# Patient Record
Sex: Female | Born: 1945 | Race: Black or African American | Hispanic: No | Marital: Married
Health system: Southern US, Community
[De-identification: ages and names within clinical notes are randomized; demographics above are authoritative.]

## PROBLEM LIST (undated history)

## (undated) DIAGNOSIS — M109 Gout, unspecified: Secondary | ICD-10-CM

## (undated) DIAGNOSIS — I10 Essential (primary) hypertension: Secondary | ICD-10-CM

## (undated) DIAGNOSIS — E119 Type 2 diabetes mellitus without complications: Secondary | ICD-10-CM

## (undated) HISTORY — DX: Type 2 diabetes mellitus without complications: E11.9

## (undated) HISTORY — DX: Essential (primary) hypertension: I10

## (undated) HISTORY — DX: Gout, unspecified: M10.9

---

## 2020-07-06 ENCOUNTER — Emergency Department (HOSPITAL_COMMUNITY): Payer: Medicare (Managed Care)

## 2020-07-06 ENCOUNTER — Encounter (HOSPITAL_COMMUNITY): Payer: Self-pay | Admitting: Emergency Medicine

## 2020-07-06 ENCOUNTER — Emergency Department (HOSPITAL_COMMUNITY)
Admission: EM | Admit: 2020-07-06 | Discharge: 2020-07-06 | Disposition: A | Discharge status: Home / Self Care | Payer: Medicare (Managed Care) | Attending: Emergency Medicine | Admitting: Emergency Medicine

## 2020-07-06 ENCOUNTER — Encounter: Payer: Self-pay | Admitting: Emergency Medicine

## 2020-07-06 ENCOUNTER — Other Ambulatory Visit: Payer: Self-pay

## 2020-07-06 DIAGNOSIS — I2699 Other pulmonary embolism without acute cor pulmonale: Secondary | ICD-10-CM | POA: Insufficient documentation

## 2020-07-06 DIAGNOSIS — J189 Pneumonia, unspecified organism: Secondary | ICD-10-CM | POA: Insufficient documentation

## 2020-07-06 DIAGNOSIS — Z20822 Contact with and (suspected) exposure to covid-19: Secondary | ICD-10-CM | POA: Insufficient documentation

## 2020-07-06 DIAGNOSIS — Z79899 Other long term (current) drug therapy: Secondary | ICD-10-CM | POA: Insufficient documentation

## 2020-07-06 DIAGNOSIS — E119 Type 2 diabetes mellitus without complications: Secondary | ICD-10-CM | POA: Diagnosis not present

## 2020-07-06 DIAGNOSIS — E860 Dehydration: Secondary | ICD-10-CM | POA: Insufficient documentation

## 2020-07-06 DIAGNOSIS — R531 Weakness: Secondary | ICD-10-CM

## 2020-07-06 DIAGNOSIS — Z7984 Long term (current) use of oral hypoglycemic drugs: Secondary | ICD-10-CM | POA: Diagnosis not present

## 2020-07-06 DIAGNOSIS — I10 Essential (primary) hypertension: Secondary | ICD-10-CM | POA: Insufficient documentation

## 2020-07-06 DIAGNOSIS — M109 Gout, unspecified: Secondary | ICD-10-CM | POA: Insufficient documentation

## 2020-07-06 DIAGNOSIS — Z7982 Long term (current) use of aspirin: Secondary | ICD-10-CM | POA: Diagnosis not present

## 2020-07-06 LAB — CBC WITH DIFFERENTIAL/PLATELET
Abs Immature Granulocytes: 0.05 10*3/uL (ref 0.00–0.07)
Basophils Absolute: 0.1 10*3/uL (ref 0.0–0.1)
Basophils Relative: 1 %
Eosinophils Absolute: 0.2 10*3/uL (ref 0.0–0.5)
Eosinophils Relative: 2 %
HCT: 30.4 % — ABNORMAL LOW (ref 36.0–46.0)
Hemoglobin: 9.7 g/dL — ABNORMAL LOW (ref 12.0–15.0)
Immature Granulocytes: 0 %
Lymphocytes Relative: 17 %
Lymphs Abs: 1.9 10*3/uL (ref 0.7–4.0)
MCH: 30.6 pg (ref 26.0–34.0)
MCHC: 31.9 g/dL (ref 30.0–36.0)
MCV: 95.9 fL (ref 80.0–100.0)
Monocytes Absolute: 0.8 10*3/uL (ref 0.1–1.0)
Monocytes Relative: 7 %
Neutro Abs: 8.4 10*3/uL — ABNORMAL HIGH (ref 1.7–7.7)
Neutrophils Relative %: 73 %
Platelets: 155 10*3/uL (ref 150–400)
RBC: 3.17 MIL/uL — ABNORMAL LOW (ref 3.87–5.11)
RDW: 13.5 % (ref 11.5–15.5)
WBC: 11.4 10*3/uL — ABNORMAL HIGH (ref 4.0–10.5)
nRBC: 0 % (ref 0.0–0.2)

## 2020-07-06 LAB — COMPREHENSIVE METABOLIC PANEL
ALT: 16 U/L (ref 0–44)
AST: 27 U/L (ref 15–41)
Albumin: 3.4 g/dL — ABNORMAL LOW (ref 3.5–5.0)
Alkaline Phosphatase: 84 U/L (ref 38–126)
Anion gap: 7 (ref 5–15)
BUN: 24 mg/dL — ABNORMAL HIGH (ref 8–23)
CO2: 22 mmol/L (ref 22–32)
Calcium: 8.5 mg/dL — ABNORMAL LOW (ref 8.9–10.3)
Chloride: 107 mmol/L (ref 98–111)
Creatinine, Ser: 1.21 mg/dL — ABNORMAL HIGH (ref 0.44–1.00)
GFR, Estimated: 46 mL/min — ABNORMAL LOW (ref >60)
Glucose, Bld: 236 mg/dL — ABNORMAL HIGH (ref 70–99)
Potassium: 4.5 mmol/L (ref 3.5–5.1)
Sodium: 136 mmol/L (ref 135–145)
Total Bilirubin: 0.8 mg/dL (ref 0.3–1.2)
Total Protein: 6.2 g/dL — ABNORMAL LOW (ref 6.5–8.1)

## 2020-07-06 LAB — URINALYSIS, ROUTINE W REFLEX MICROSCOPIC
Bilirubin Urine: NEGATIVE
Glucose, UA: 150 mg/dL — AB
Hgb urine dipstick: NEGATIVE
Ketones, ur: NEGATIVE mg/dL
Leukocytes,Ua: NEGATIVE
Nitrite: NEGATIVE
Protein, ur: NEGATIVE mg/dL
Specific Gravity, Urine: 1.012 (ref 1.005–1.030)
pH: 7 (ref 5.0–8.0)

## 2020-07-06 LAB — RESP PANEL BY RT-PCR (FLU A&B, COVID) ARPGX2
Influenza A by PCR: NEGATIVE
Influenza B by PCR: NEGATIVE
SARS Coronavirus 2 by RT PCR: NEGATIVE

## 2020-07-06 LAB — ETHANOL: Alcohol, Ethyl (B): <10 mg/dL (ref <10)

## 2020-07-06 LAB — CBG MONITORING, ED: Glucose-Capillary: 256 mg/dL — ABNORMAL HIGH (ref 70–99)

## 2020-07-06 MED ORDER — DOXYCYCLINE HYCLATE 100 MG PO CAPS: 100.0000 mg | ORAL_CAPSULE | Freq: Two times a day (BID) | ORAL | 0 refills | Status: AC

## 2020-07-06 MED ORDER — IOHEXOL 350 MG/ML SOLN
75.0000 mL | Freq: Once | INTRAVENOUS | Status: AC | PRN
  Administered 2020-07-06: 75 mL via INTRAVENOUS

## 2020-07-06 MED ORDER — SODIUM CHLORIDE 0.9 % IV BOLUS
1000.0000 mL | Freq: Once | INTRAVENOUS | Status: AC
Start: 2020-07-06 — End: 2020-07-06
  Administered 2020-07-06: 1000 mL via INTRAVENOUS

## 2020-07-06 MED ORDER — ONDANSETRON 4 MG PO TBDP: 4.0000 mg | ORAL_TABLET | Freq: Three times a day (TID) | ORAL | 0 refills | Status: AC | PRN

## 2020-07-06 MED ORDER — DOXYCYCLINE HYCLATE 100 MG PO TABS
100.0000 mg | ORAL_TABLET | Freq: Once | ORAL | Status: AC
  Administered 2020-07-06: 100 mg via ORAL
  Filled 2020-07-06: qty 1

## 2020-07-06 MED ORDER — SODIUM CHLORIDE 0.9 % IV SOLN
12.5000 mg | Freq: Four times a day (QID) | INTRAVENOUS | Status: DC | PRN
  Filled 2020-07-06: qty 0.5

## 2020-07-06 MED ORDER — MECLIZINE HCL 25 MG PO TABS
25.0000 mg | ORAL_TABLET | Freq: Once | ORAL | Status: AC
  Administered 2020-07-06: 25 mg via ORAL
  Filled 2020-07-06: qty 1

## 2020-07-06 NOTE — ED Provider Notes (Addendum)
[facility] EMERGENCY DEPARTMENT Provider Note   CSN: [id number] Arrival date & time: 07/06/20  1637     History Chief Complaint  Patient presents with   Weakness    Jordan Clark is a 75 y.o. female.  Pt presents to the ED today with generalized weakness.  Pt is visiting from [facility] and was at an indoor family reunion.  She had a sudden onset of weakness all over.  She felt nauseous.  EMS gave her 4 mg zofran en route.  Pt is not a good historian, but was able to tell me she had money in her bra when we removed it.  Pt's husband said she was fine this am.  She did use some medical marijuana and had an alcoholic drink.  She had a few more drinks at the reunion. =    Past Medical History:  Diagnosis Date   Diabetes mellitus without complication (HCC)    Gout    Hypertension     Patient Active Problem List   Diagnosis Date Noted   Gout 07/06/2020    History reviewed. No pertinent surgical history.   OB History   No obstetric history on file.     No family history on file.     Home Medications Prior to Admission medications   Medication Sig Start Date End Date Taking? Authorizing Provider  allopurinol (ZYLOPRIM) 100 MG tablet Take 100 mg by mouth daily. 05/27/20  Yes [provider]  amLODipine (NORVASC) 10 MG tablet Take 10 mg by mouth daily.   Yes [provider]  aspirin EC 81 MG tablet Take 81 mg by mouth daily. Swallow whole.   Yes [provider]  carvedilol (COREG) 25 MG tablet Take 25 mg by mouth 2 (two) times daily. 06/21/20  Yes [provider]  cetirizine (ZYRTEC) 10 MG tablet Take 10 mg by mouth daily as needed for allergies.   Yes [provider]  doxycycline (VIBRAMYCIN) 100 MG capsule Take 1 capsule (100 mg total) by mouth 2 (two) times daily. 07/06/20  Yes Jacalyn Lefevre, MD  FERROUS GLUCONATE PO Take 1 tablet by mouth daily.   Yes [provider]  glimepiride (AMARYL) 1 MG tablet  Take 1 mg by mouth daily. 04/26/20  Yes [provider]  metFORMIN (GLUCOPHAGE) 1000 MG tablet Take 1,000 mg by mouth 2 (two) times daily. 05/27/20  Yes [provider]  Multiple Vitamins-Minerals (ONE-A-DAY WOMENS PO) Take 1 tablet by mouth daily.   Yes [provider]  Omega-3 Fatty Acids (FISH OIL) 1200 MG CAPS Take 1,200 mg by mouth in the morning and at bedtime.   Yes [provider]  ondansetron (ZOFRAN ODT) 4 MG disintegrating tablet Take 1 tablet (4 mg total) by mouth every 8 (eight) hours as needed for nausea or vomiting. 07/06/20  Yes Jacalyn Lefevre, MD  oxybutynin (DITROPAN) 5 MG tablet Take 5 mg by mouth 3 (three) times daily. 06/14/20  Yes [provider]  simvastatin (ZOCOR) 10 MG tablet Take 10 mg by mouth at bedtime.   Yes [provider]  Vitamin D, Cholecalciferol, 25 MCG (1000 UT) TABS Take 1,000 Units by mouth daily.   Yes [provider]    Allergies    Lisinopril, Red dye, Strawberry extract, and Shellfish allergy  Review of Systems   Review of Systems  Unable to perform ROS: Mental status change  Gastrointestinal:  Positive for nausea.  All other systems reviewed and are negative.  Physical Exam  Updated Vital Signs BP (!) 143/69   Pulse 65   Temp (!) 97.5 F (36.4 C) (Oral)   Resp 17   Ht 5' 7.5" (1.715 m)   Wt 89.8 kg   SpO2 92%   BMI 30.55 kg/m   Physical Exam Vitals and nursing note reviewed.  Constitutional:      Appearance: Normal appearance. She is obese.  HENT:     Head: Normocephalic and atraumatic.     Right Ear: External ear normal.     Left Ear: External ear normal.     Nose: Nose normal.     Mouth/Throat:     Mouth: Mucous membranes are moist.     Pharynx: Oropharynx is clear.  Eyes:     Extraocular Movements: Extraocular movements intact.     Conjunctiva/sclera: Conjunctivae normal.     Pupils: Pupils are equal, round, and reactive to light.  Cardiovascular:     Rate and  Rhythm: Normal rate and regular rhythm.     Pulses: Normal pulses.     Heart sounds: Normal heart sounds.  Pulmonary:     Effort: Pulmonary effort is normal.     Breath sounds: Normal breath sounds.  Abdominal:     General: Abdomen is flat. Bowel sounds are normal.     Palpations: Abdomen is soft.  Musculoskeletal:        General: Normal range of motion.     Cervical back: Normal range of motion and neck supple.  Skin:    General: Skin is warm.     Capillary Refill: Capillary refill takes less than 2 seconds.  Neurological:     Mental Status: She is alert.     Comments: Pt will not answer a lot of questions.  She is moving all 4 extremities, but required me to hold her up while the nurse removed her shirt.    ED Results / Procedures / Treatments   Labs (all labs ordered are listed, but only abnormal results are displayed) Labs Reviewed  CBC WITH DIFFERENTIAL/PLATELET - Abnormal; Notable for the following components:      Result Value   WBC 11.4 (*)    RBC 3.17 (*)    Hemoglobin 9.7 (*)    HCT 30.4 (*)    Neutro Abs 8.4 (*)    All other components within normal limits  COMPREHENSIVE METABOLIC PANEL - Abnormal; Notable for the following components:   Glucose, Bld 236 (*)    BUN 24 (*)    Creatinine, Ser 1.21 (*)    Calcium 8.5 (*)    Total Protein 6.2 (*)    Albumin 3.4 (*)    GFR, Estimated 46 (*)    All other components within normal limits  URINALYSIS, ROUTINE W REFLEX MICROSCOPIC - Abnormal; Notable for the following components:   Glucose, UA 150 (*)    All other components within normal limits  CBG MONITORING, ED - Abnormal; Notable for the following components:   Glucose-Capillary 256 (*)    All other components within normal limits  RESP PANEL BY RT-PCR (FLU A&B, COVID) [id number]  ETHANOL    EKG EKG Interpretation  Date/Time:  Saturday July 06 2020 16:51:41 EDT Ventricular Rate:  66 PR Interval:  210 QRS Duration: 107 QT Interval:  434 QTC  Calculation: 455 R Axis:   -30 Text Interpretation: Sinus rhythm Abnormal R-wave progression, early transition LVH with secondary repolarization abnormality No old tracing to compare Confirmed by Jacalyn Lefevre [phone]) on 07/06/2020 4:56:11 PM  Radiology  CT Head Wo Contrast  Result Date: 07/06/2020 CLINICAL DATA:  Altered mental status. Sudden onset generalized weakness and heavy sensations with lightheadedness. Nausea. EXAM: CT HEAD WITHOUT CONTRAST TECHNIQUE: Contiguous axial images were obtained from the base of the skull through the vertex without intravenous contrast. COMPARISON:  None. FINDINGS: Brain: Mildly enlarged ventricles and cortical sulci. Moderate patchy white matter low density in both cerebral hemispheres. No intracranial hemorrhage, mass lesion or CT evidence of acute infarction. Vascular: No hyperdense vessel or unexpected calcification. Skull: Normal. Negative for fracture or focal lesion. Sinuses/Orbits: Unremarkable. Other: None. IMPRESSION: 1. No acute abnormality. 2. Mild few cerebral atrophy. 3. 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 Electronically Signed   By: Beckie SaltsSteven  Reid M.D.   On: 07/06/2020 18:25   CT Angio Chest PE W and/or Wo Contrast  Result Date: 07/06/2020 CLINICAL DATA:  High probability for pulmonary embolus. Altered mental status. Weakness. EXAM: CT ANGIOGRAPHY CHEST WITH CONTRAST TECHNIQUE: Multidetector CT imaging of the chest was performed using the standard protocol during bolus administration of intravenous contrast. Multiplanar CT image reconstructions and MIPs were obtained to evaluate the vascular anatomy. CONTRAST:  75mL OMNIPAQUE IOHEXOL 350 MG/ML SOLN COMPARISON:  Chest x-ray July 06, 2020 FINDINGS: Cardiovascular: Calcified atherosclerosis involving the left main, left circumflex, and left anterior descending coronary arteries. The heart size is borderline to mildly enlarged. Calcified atherosclerosis in  the thoracic aorta without aneurysm or dissection. The main pulmonary artery is normal in caliber measuring 3 cm. No pulmonary emboli are identified. Mediastinum/Nodes: No pleural or pericardial effusions. The esophagus and thyroid are unremarkable. No adenopathy. The chest wall is unremarkable. Lungs/Pleura: Central airways are unremarkable. Bronchial wall thickening is seen in the bases. No pneumothorax. The pulmonary opacities primarily in the lingula, bilateral lower lobes, and right upper lobe are largely platelike in appearance and at least partially explained by atelectasis. There is mixed attenuation in the lungs bilaterally with regions of higher and lower attenuation suggesting air trapping. There is more focal ground-glass in the right upper lobe seen on axial image 40 and coronal image 95. Upper Abdomen: No acute abnormality. Musculoskeletal: No chest wall abnormality. No acute or significant osseous findings. Review of the MIP images confirms the above findings. IMPRESSION: 1. Mixed attenuation of the lung parenchyma suggests regions of air trapping. 2. Pulmonary opacities in the bilateral lower lobes, lingula, and right upper lobe are identified. These opacities largely demonstrate platelike characteristics suggesting multiple regions of subsegmental atelectasis. However, there is more focal ground-glass in the right upper lobe and a superimposed infectious process should be considered. Recommend short-term follow-up imaging to ensure resolution. 3. Bronchial wall thickening in the bases is age indeterminate but may represent bronchitis. 4. No pulmonary emboli identified. 5. Calcified atherosclerosis in the thoracic aorta and left coronary arteries. Aortic Atherosclerosis (ICD10-I70.0). Electronically Signed   By: Gerome Samavid  Williams III M.D   On: 07/06/2020 22:18   DG Chest Portable 1 View  Result Date: 07/06/2020 CLINICAL DATA:  Altered mental status and weakness. EXAM: PORTABLE CHEST 1 VIEW  COMPARISON:  None. FINDINGS: Lungs are hypoinflated with hazy patchy opacification over the right upper lung and left mid to lower lung. Findings may be due to asymmetric edema versus infection. No effusion. Cardiac silhouette is within normal. Prominent hilar regions which may be due to adenopathy. Moderate gaseous distention of the stomach. Degenerative change of the spine. IMPRESSION: 1. Hazy patchy opacification over the right upper lung and left mid to lower lung  which may be due to asymmetric edema versus infection. 2. Prominent bilateral hilar regions which may be due to adenopathy. Consider chest CT with intravenous contrast for further evaluation. Electronically Signed   By: Elberta Fortis M.D.   On: 07/06/2020 17:17    Procedures Procedures   Medications Ordered in ED Medications  promethazine (PHENERGAN) 12.5 mg in sodium chloride 0.9 % 50 mL IVPB (has no administration in time range)  doxycycline (VIBRA-TABS) tablet 100 mg (has no administration in time range)  sodium chloride 0.9 % bolus 1,000 mL (0 mLs Intravenous Stopped 07/06/20 1837)  meclizine (ANTIVERT) tablet 25 mg (25 mg Oral Given 07/06/20 2034)  iohexol (OMNIPAQUE) 350 MG/ML injection 75 mL (75 mLs Intravenous Contrast Given 07/06/20 2143)    ED Course  I have reviewed the triage vital signs and the nursing notes.  Pertinent labs & imaging results that were available during my care of the patient were reviewed by me and considered in my medical decision making (see chart for details).    MDM Rules/Calculators/A&P                          Pt's O2 sat dropped to 85%.  The pt was put on 2L.  The pt was given IVFs and zofran and antivert.  She feels much better and is back to her baseline.  CXR slightly abnormal, so a CTA was done.  This did not show a PE although shows possible infectious process.  The pt will be started on abx.  Pt is negative for Covid.  Pt's oxygen sat is now in the 90s on RA.  Pt is stable for d/c.  She is  to return if worse.   Final Clinical Impression(s) / ED Diagnoses Final diagnoses:  Weakness  Dehydration  Community acquired pneumonia, unspecified laterality    Rx / DC Orders ED Discharge Orders          Ordered    doxycycline (VIBRAMYCIN) 100 MG capsule  2 times daily        07/06/20 2232    ondansetron (ZOFRAN ODT) 4 MG disintegrating tablet  Every 8 hours PRN        07/06/20 2232             Jacalyn Lefevre, MD 07/06/20 2229    Jacalyn Lefevre, MD 07/06/20 2233

## 2020-07-06 NOTE — ED Triage Notes (Signed)
Pt BIB [facility] c/o sudden onset generalized weakness and heaviness with lightheadedness. Pt endorses nausea, no vomiting. Pt weak and drowsy. Pt given 4mg  zofran. EKG unremarkable.   EMS VS- 170/80, HR 60

## 2020-07-06 NOTE — ED Notes (Signed)
Pt SpO2 85% on room air. Pt placed on 2L via [place].SpO2 currently 96% on 2L.

## 2020-07-06 NOTE — ED Notes (Signed)
Pt verbalizes understanding of discharge instructions. Opportunity for questions and answers were provided. Pt discharged from the ED.   ?

## 2021-12-14 IMAGING — CT CT ANGIO CHEST
2 of 7 series · 16 of 46 positions shown · IV contrast (APPLIED)
Comparison: Chest x-ray July 06, 2020

CLINICAL DATA: High probability for pulmonary embolus. Altered
mental status. Weakness.

EXAM:
CT ANGIOGRAPHY CHEST WITH CONTRAST
TECHNIQUE: Multidetector CT imaging of the chest was performed using the
standard protocol during bolus administration of intravenous
contrast. Multiplanar CT image reconstructions and MIPs were
obtained to evaluate the vascular anatomy.
CONTRAST:  75mL OMNIPAQUE IOHEXOL 350 MG/ML SOLN

[Series 6: thins · axial · 0.73mm/px · z∈[+89,+308]mm · 13 of 350 slices shown]
[im 19/350  lung]
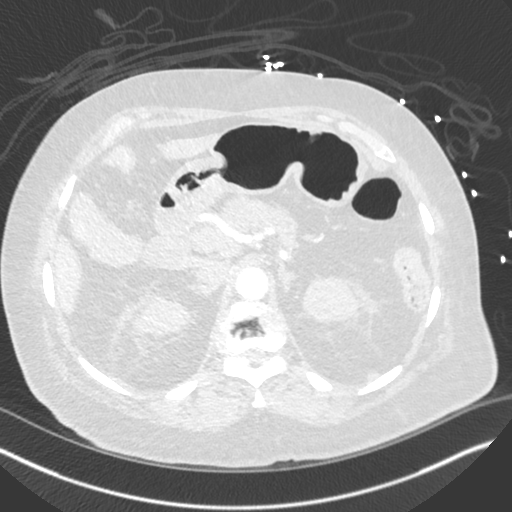
[im 56/350  soft-tissue]
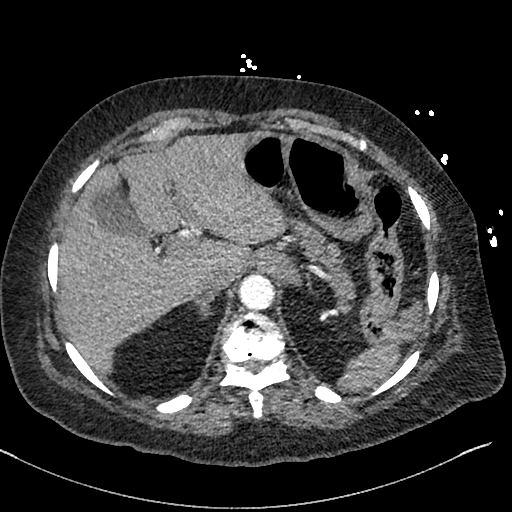
[im 74/350  lung]
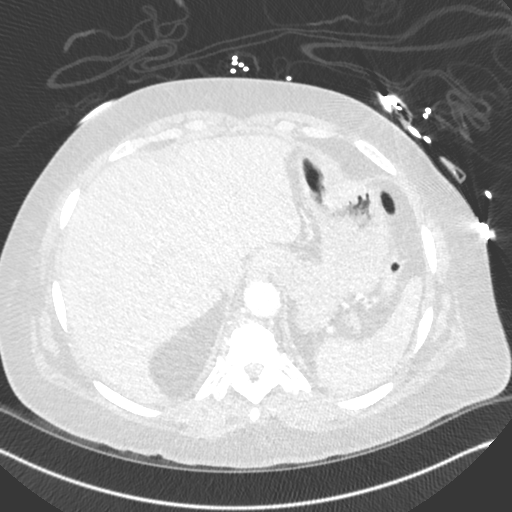
[im 92/350  soft-tissue]
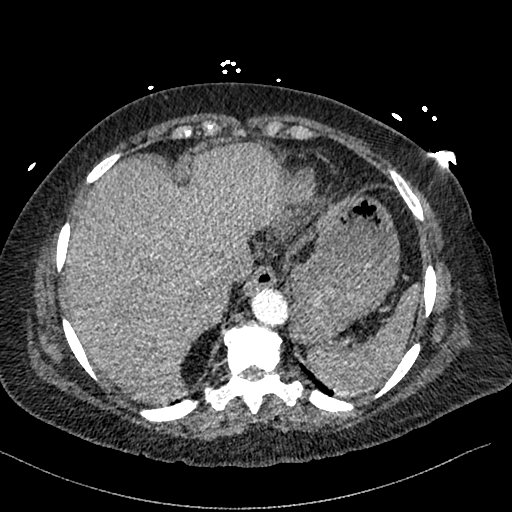
[im 129/350  lung]
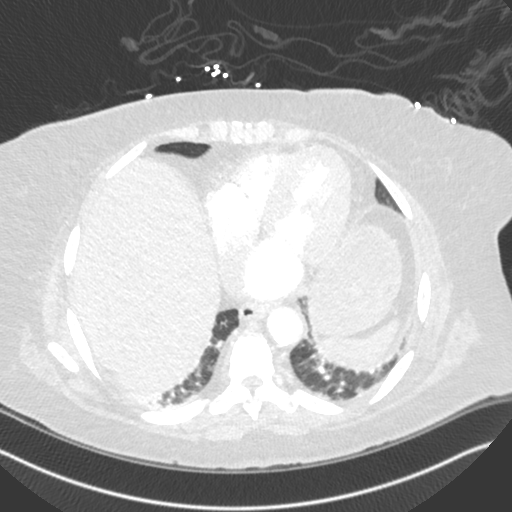
[im 147/350  soft-tissue]
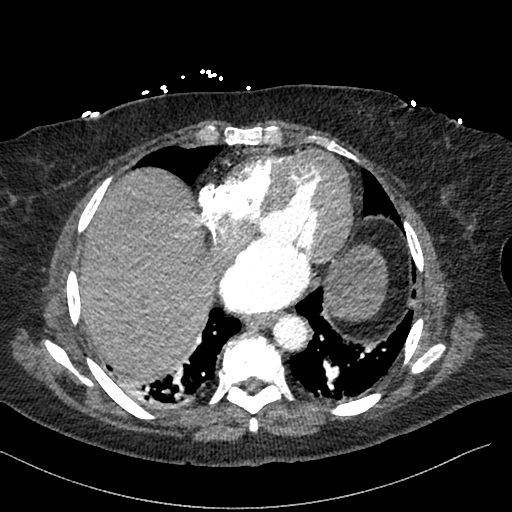
[im 184/350  lung]
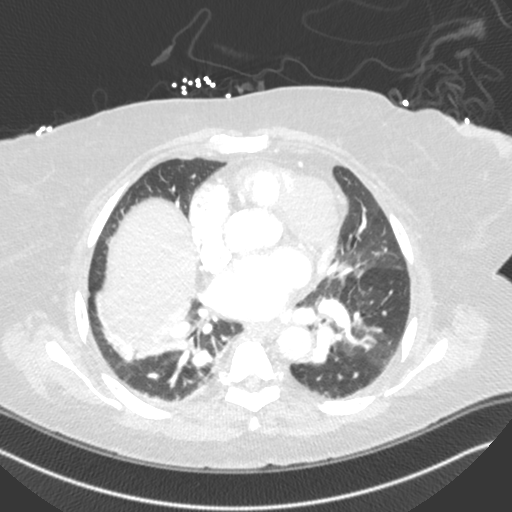
[im 203/350  soft-tissue]
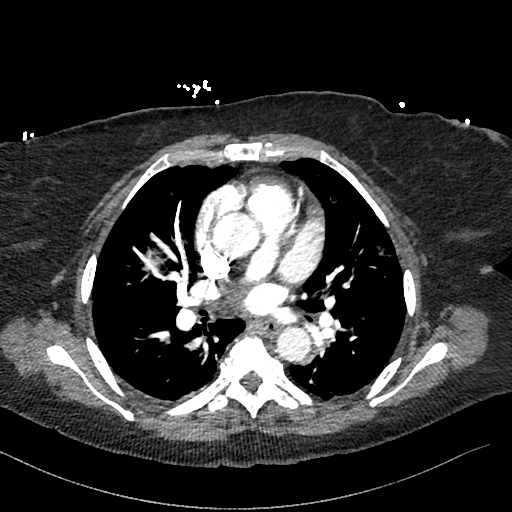
[im 221/350  lung]
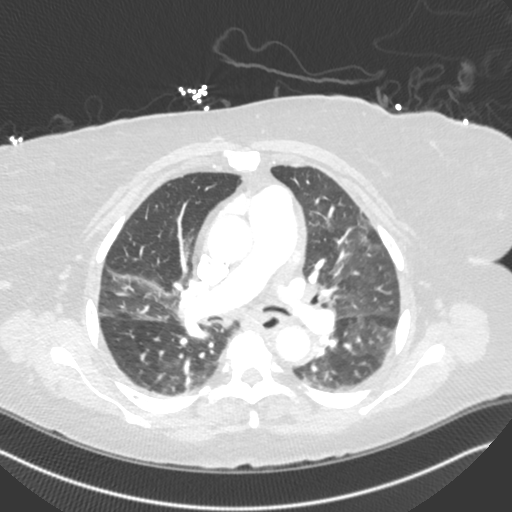
[im 258/350  soft-tissue]
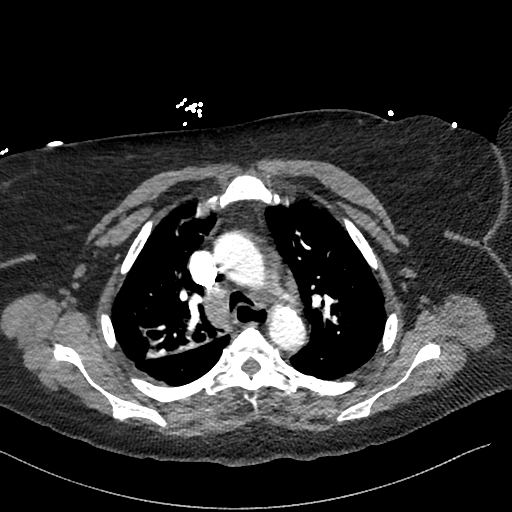
[im 276/350  lung]
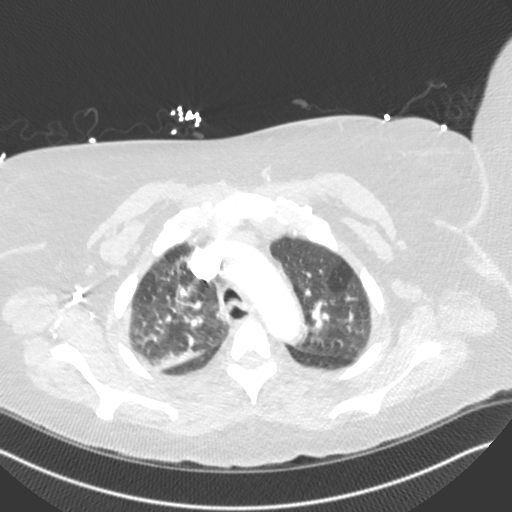
[im 294/350  soft-tissue]
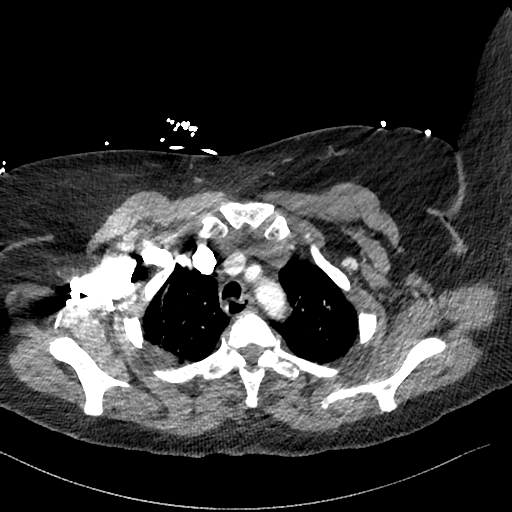
[im 331/350  lung]
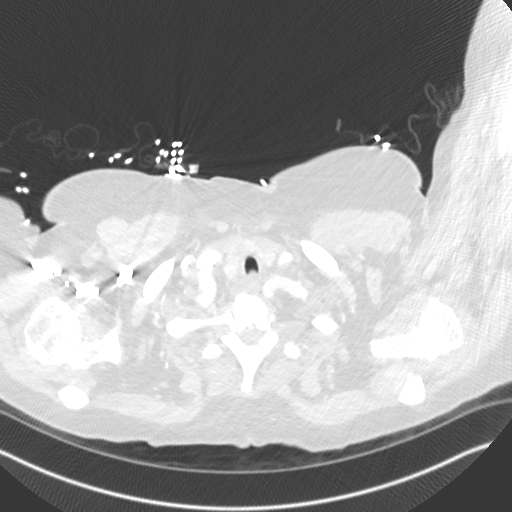

[Series 10: cor · coronal · 0.48mm/px · 3 of 154 slices shown]
[im 39/154  soft-tissue]
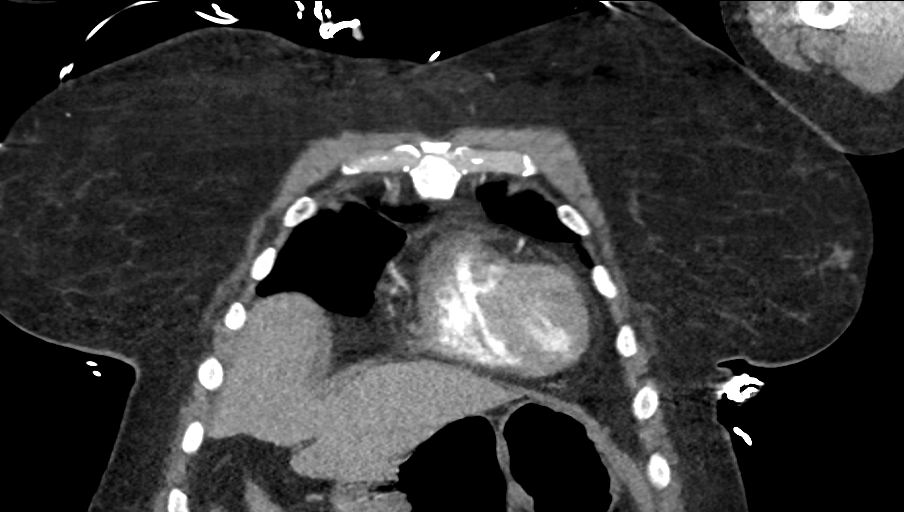
[im 77/154  soft-tissue]
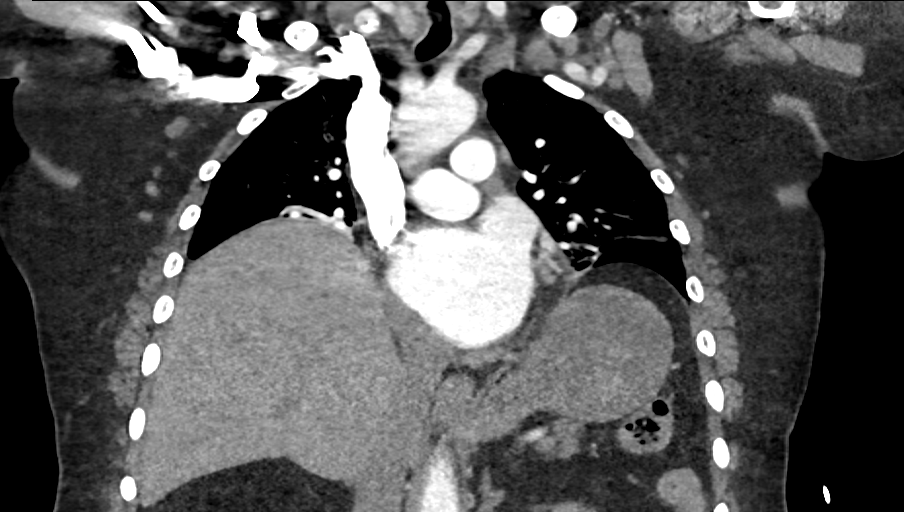
[im 115/154  soft-tissue]
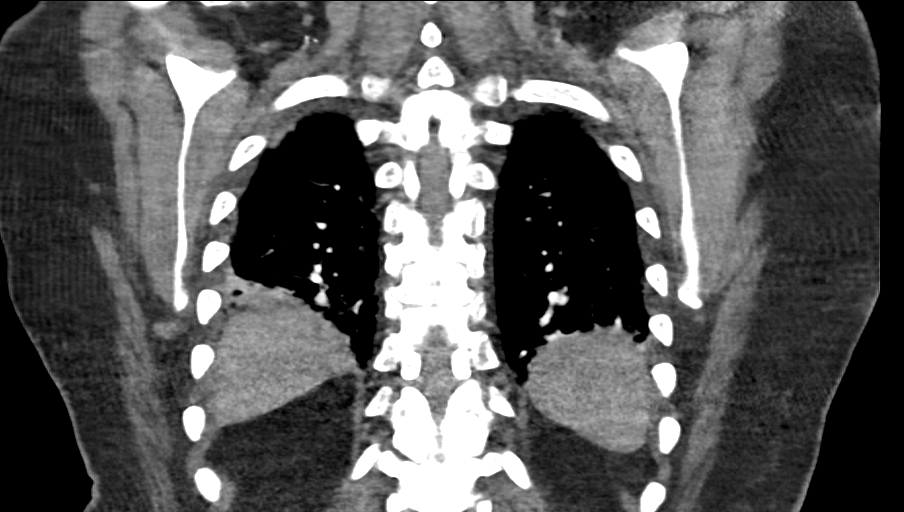

[16 of 46 positions shown; findings below may reference images not displayed]

FINDINGS: Cardiovascular: Calcified atherosclerosis involving the left main,
left circumflex, and left anterior descending coronary arteries. The
heart size is borderline to mildly enlarged. Calcified
atherosclerosis in the thoracic aorta without aneurysm or
dissection. The main pulmonary artery is normal in caliber measuring
3 cm. No pulmonary emboli are identified.

Mediastinum/Nodes: No pleural or pericardial effusions. The
esophagus and thyroid are unremarkable. No adenopathy. The chest
wall is unremarkable.

Lungs/Pleura: Central airways are unremarkable. Bronchial wall
thickening is seen in the bases. No pneumothorax. The pulmonary
opacities primarily in the lingula, bilateral lower lobes, and right
upper lobe are largely platelike in appearance and at least
partially explained by atelectasis. There is mixed attenuation in
the lungs bilaterally with regions of higher and lower attenuation
suggesting air trapping. There is more focal ground-glass in the
right upper lobe seen on axial image 40 and coronal image 95.

Upper Abdomen: No acute abnormality.

Musculoskeletal: No chest wall abnormality. No acute or significant
osseous findings.

Review of the MIP images confirms the above findings.
IMPRESSION: 1. Mixed attenuation of the lung parenchyma suggests regions of air
trapping.
2. Pulmonary opacities in the bilateral lower lobes, lingula, and
right upper lobe are identified. These opacities largely demonstrate
platelike characteristics suggesting multiple regions of
subsegmental atelectasis. However, there is more focal ground-glass
in the right upper lobe and a superimposed infectious process should
be considered. Recommend short-term follow-up imaging to ensure
resolution.
3. Bronchial wall thickening in the bases is age indeterminate but
may represent bronchitis.
4. No pulmonary emboli identified.
5. Calcified atherosclerosis in the thoracic aorta and left coronary
arteries.

Aortic Atherosclerosis ([id number].P).
